# Patient Record
Sex: Male | Born: 1980
Health system: Southern US, Community
[De-identification: ages and names within clinical notes are randomized; demographics above are authoritative.]

---

## 2016-01-21 DIAGNOSIS — D2261 Melanocytic nevi of right upper limb, including shoulder: Secondary | ICD-10-CM | POA: Diagnosis not present

## 2016-01-21 DIAGNOSIS — D225 Melanocytic nevi of trunk: Secondary | ICD-10-CM | POA: Diagnosis not present

## 2016-01-21 DIAGNOSIS — D2271 Melanocytic nevi of right lower limb, including hip: Secondary | ICD-10-CM | POA: Diagnosis not present

## 2016-01-21 DIAGNOSIS — Z872 Personal history of diseases of the skin and subcutaneous tissue: Secondary | ICD-10-CM | POA: Diagnosis not present

## 2016-10-07 DIAGNOSIS — D225 Melanocytic nevi of trunk: Secondary | ICD-10-CM | POA: Diagnosis not present

## 2016-10-07 DIAGNOSIS — Z872 Personal history of diseases of the skin and subcutaneous tissue: Secondary | ICD-10-CM | POA: Diagnosis not present

## 2016-10-07 DIAGNOSIS — D2272 Melanocytic nevi of left lower limb, including hip: Secondary | ICD-10-CM | POA: Diagnosis not present

## 2016-10-16 ENCOUNTER — Emergency Department (HOSPITAL_COMMUNITY): Payer: 59

## 2016-10-16 ENCOUNTER — Encounter (HOSPITAL_COMMUNITY): Payer: Self-pay | Admitting: Emergency Medicine

## 2016-10-16 ENCOUNTER — Emergency Department (HOSPITAL_COMMUNITY)
Admission: EM | Admit: 2016-10-16 | Discharge: 2016-10-16 | Disposition: A | Discharge status: Home / Self Care | Payer: 59 | Attending: Emergency Medicine | Admitting: Emergency Medicine

## 2016-10-16 DIAGNOSIS — F17210 Nicotine dependence, cigarettes, uncomplicated: Secondary | ICD-10-CM | POA: Insufficient documentation

## 2016-10-16 DIAGNOSIS — R109 Unspecified abdominal pain: Secondary | ICD-10-CM | POA: Diagnosis not present

## 2016-10-16 DIAGNOSIS — R1011 Right upper quadrant pain: Secondary | ICD-10-CM | POA: Diagnosis not present

## 2016-10-16 LAB — CBC WITH DIFFERENTIAL/PLATELET
Basophils Absolute: 0 10*3/uL (ref 0.0–0.1)
Basophils Relative: 0 %
EOS ABS: 0.1 10*3/uL (ref 0.0–0.7)
EOS PCT: 2 %
HCT: 44.9 % (ref 39.0–52.0)
Hemoglobin: 15.1 g/dL (ref 13.0–17.0)
LYMPHS ABS: 3.1 10*3/uL (ref 0.7–4.0)
Lymphocytes Relative: 40 %
MCH: 30.9 pg (ref 26.0–34.0)
MCHC: 33.6 g/dL (ref 30.0–36.0)
MCV: 92 fL (ref 78.0–100.0)
MONOS PCT: 13 %
Monocytes Absolute: 1 10*3/uL (ref 0.1–1.0)
Neutro Abs: 3.6 10*3/uL (ref 1.7–7.7)
Neutrophils Relative %: 45 %
PLATELETS: 229 10*3/uL (ref 150–400)
RBC: 4.88 MIL/uL (ref 4.22–5.81)
RDW: 13.4 % (ref 11.5–15.5)
WBC: 7.9 10*3/uL (ref 4.0–10.5)

## 2016-10-16 LAB — COMPREHENSIVE METABOLIC PANEL
ALK PHOS: 58 U/L (ref 38–126)
ALT: 18 U/L (ref 17–63)
AST: 18 U/L (ref 15–41)
Albumin: 4.3 g/dL (ref 3.5–5.0)
Anion gap: 8 (ref 5–15)
BILIRUBIN TOTAL: 0.7 mg/dL (ref 0.3–1.2)
BUN: 15 mg/dL (ref 6–20)
CALCIUM: 8.9 mg/dL (ref 8.9–10.3)
CO2: 27 mmol/L (ref 22–32)
CREATININE: 0.71 mg/dL (ref 0.61–1.24)
Chloride: 102 mmol/L (ref 101–111)
GFR calc non Af Amer: >60 mL/min (ref >60)
GLUCOSE: 104 mg/dL — AB (ref 65–99)
Potassium: 3.7 mmol/L (ref 3.5–5.1)
SODIUM: 137 mmol/L (ref 135–145)
TOTAL PROTEIN: 7.9 g/dL (ref 6.5–8.1)

## 2016-10-16 LAB — URINALYSIS, ROUTINE W REFLEX MICROSCOPIC
BILIRUBIN URINE: NEGATIVE
Glucose, UA: NEGATIVE mg/dL
HGB URINE DIPSTICK: NEGATIVE
KETONES UR: NEGATIVE mg/dL
Leukocytes, UA: NEGATIVE
Nitrite: NEGATIVE
PROTEIN: NEGATIVE mg/dL
Specific Gravity, Urine: 1.029 (ref 1.005–1.030)
pH: 7 (ref 5.0–8.0)

## 2016-10-16 LAB — LIPASE, BLOOD: Lipase: 33 U/L (ref 11–51)

## 2016-10-16 MED ORDER — ONDANSETRON HCL 4 MG/2ML IJ SOLN: 4.0000 mg | INTRAMUSCULAR | Status: DC | PRN

## 2016-10-16 MED ORDER — MORPHINE SULFATE (PF) 2 MG/ML IV SOLN: 2.0000 mg | INTRAVENOUS | Status: DC | PRN

## 2016-10-16 MED ORDER — IOPAMIDOL (ISOVUE-300) INJECTION 61%
100.0000 mL | Freq: Once | INTRAVENOUS | Status: AC | PRN
  Administered 2016-10-16: 100 mL via INTRAVENOUS

## 2016-10-16 MED ORDER — FAMOTIDINE 20 MG PO TABS: 20.0000 mg | ORAL_TABLET | Freq: Two times a day (BID) | ORAL | 0 refills | Status: AC

## 2016-10-16 MED ORDER — HYDROCODONE-ACETAMINOPHEN 5-325 MG PO TABS: ORAL_TABLET | ORAL | 0 refills | Status: AC

## 2016-10-16 NOTE — ED Notes (Signed)
Pt. Reports increase in pain. Pt. Offered prn pain medication and denied it. Will continue to monitor.

## 2016-10-16 NOTE — ED Triage Notes (Signed)
Pt c/o right side abd pain since 0100 Wednesday am. Denies n/v/d.

## 2016-10-16 NOTE — Discharge Instructions (Signed)
Eat a bland diet, avoiding greasy, fatty, fried foods, as well as spicy and acidic foods or beverages.  Avoid eating within the hour or 2 before going to bed or laying down.  Also avoid teas, colas, coffee, chocolate, pepermint and spearment.  Take the prescriptions as directed.  Your CT scan showed an incidental finding:  "Two peripherally enhancing abnormalities are noted in the right hepatic lobe, the largest measuring 6.4 x 5.5 cm. These may simply represent hemangiomas, but MRI with and without gadolinium is recommended to rule out other neoplasm or pathology."   Call your regular medical doctor and the GI doctor today to schedule a follow up appointment within the next week.  Return to the Emergency Department immediately if worsening.

## 2016-10-16 NOTE — ED Notes (Signed)
Lab unable to use urine sample intially sent to lab. UA reordered and pt asked to recollect.

## 2016-10-16 NOTE — ED Provider Notes (Signed)
[facility] DEPT Provider Note   CSN: [id number] Arrival date & time: 10/16/16  0947     History   Chief Complaint Chief Complaint  Patient presents with  . Abdominal Pain    HPI Jacobe Study Sukup is a 36 y.o. male.  HPI  Pt was seen at 0710.  Per pt, c/o gradual onset and persistence of constant RUQ abd "pain" since yesterday.  Has been associated with no other symptoms.  Denies N/V, no diarrhea, no fevers, no back pain, no rash, no CP/SOB, no black or blood in stools.      History reviewed. No pertinent past medical history.  There are no active problems to display for this patient.   History reviewed. No pertinent surgical history.     Home Medications    Prior to Admission medications   Not on File    Family History No family history on file.  Social History Social History  Substance Use Topics  . Smoking status: Current Every Day Smoker    Packs/day: 0.50    Types: Cigarettes  . Smokeless tobacco: Never Used  . Alcohol use Yes     Comment: moderate     Allergies   Patient has no known allergies.   Review of Systems Review of Systems ROS: Statement: All systems negative except as marked or noted in the HPI; Constitutional: Negative for fever and chills. ; ; Eyes: Negative for eye pain, redness and discharge. ; ; ENMT: Negative for ear pain, hoarseness, nasal congestion, sinus pressure and sore throat. ; ; Cardiovascular: Negative for chest pain, palpitations, diaphoresis, dyspnea and peripheral edema. ; ; Respiratory: Negative for cough, wheezing and stridor. ; ; Gastrointestinal: +abd pain. Negative for nausea, vomiting, diarrhea, blood in stool, hematemesis, jaundice and rectal bleeding. . ; ; Genitourinary: Negative for dysuria, flank pain and hematuria. ; ; Genital:  No penile drainage or rash, no testicular pain or swelling, no scrotal rash or swelling. ;; Musculoskeletal: Negative for back pain and neck pain. Negative for swelling and trauma.; ;   Skin: Negative for pruritus, rash, abrasions, blisters, bruising and skin lesion.; ; Neuro: Negative for headache, lightheadedness and neck stiffness. Negative for weakness, altered level of consciousness, altered mental status, extremity weakness, paresthesias, involuntary movement, seizure and syncope.       Physical Exam Updated Vital Signs BP 112/77   Pulse 76   Temp 98.1 F (36.7 C)   Resp 18   Ht 5\' 9"  (1.753 m)   Wt 63.5 kg (140 lb)   SpO2 100%   BMI 20.67 kg/m   Physical Exam 0715: Physical examination:  Nursing notes reviewed; Vital signs and O2 SAT reviewed;  Constitutional: Well developed, Well nourished, Well hydrated, In no acute distress; Head:  Normocephalic, atraumatic; Eyes: EOMI, PERRL, No scleral icterus; ENMT: Mouth and pharynx normal, Mucous membranes moist; Neck: Supple, Full range of motion, No lymphadenopathy; Cardiovascular: Regular rate and rhythm, No gallop; Respiratory: Breath sounds clear & equal bilaterally, No wheezes.  Speaking full sentences with ease, Normal respiratory effort/excursion; Chest: Nontender, Movement normal; Abdomen: Soft, +RUQ tenderness to palp. No rebound or guarding. Nondistended, Normal bowel sounds; Genitourinary: No CVA tenderness; Extremities: Pulses normal, No tenderness, No edema, No calf edema or asymmetry.; Neuro: AA&Ox3, Major CN grossly intact.  Speech clear. No gross focal motor or sensory deficits in extremities.; Skin: Color normal, Warm, Dry.   ED Treatments / Results  Labs (all labs ordered are listed, but only abnormal results are displayed)   EKG  EKG Interpretation None       Radiology   Procedures Procedures (including critical care time)  Medications Ordered in ED Medications  ondansetron (ZOFRAN) injection 4 mg (not administered)  morphine 2 MG/ML injection 2 mg (not administered)     Initial Impression / Assessment and Plan / ED Course  I have reviewed the triage vital signs and the nursing  notes.  Pertinent labs & imaging results that were available during my care of the patient were reviewed by me and considered in my medical decision making (see chart for details).  MDM Reviewed: previous chart, nursing note and vitals Reviewed previous: labs Interpretation: labs, x-ray and ultrasound    Results for orders placed or performed during the hospital encounter of 10/16/16  Comprehensive metabolic panel  Result Value Ref Range   Sodium 137 135 - 145 mmol/L   Potassium 3.7 3.5 - 5.1 mmol/L   Chloride 102 101 - 111 mmol/L   CO2 27 22 - 32 mmol/L   Glucose, Bld 104 (H) 65 - 99 mg/dL   BUN 15 6 - 20 mg/dL   Creatinine, Ser 0.71 0.61 - 1.24 mg/dL   Calcium 8.9 8.9 - 10.3 mg/dL   Total Protein 7.9 6.5 - 8.1 g/dL   Albumin 4.3 3.5 - 5.0 g/dL   AST 18 15 - 41 U/L   ALT 18 17 - 63 U/L   Alkaline Phosphatase 58 38 - 126 U/L   Total Bilirubin 0.7 0.3 - 1.2 mg/dL   GFR calc non Af Amer >60 >60 mL/min   GFR calc Af Amer >60 >60 mL/min   Anion gap 8 5 - 15  Lipase, blood  Result Value Ref Range   Lipase 33 11 - 51 U/L  CBC with Differential  Result Value Ref Range   WBC 7.9 4.0 - 10.5 K/uL   RBC 4.88 4.22 - 5.81 MIL/uL   Hemoglobin 15.1 13.0 - 17.0 g/dL   HCT 44.9 39.0 - 52.0 %   MCV 92.0 78.0 - 100.0 fL   MCH 30.9 26.0 - 34.0 pg   MCHC 33.6 30.0 - 36.0 g/dL   RDW 13.4 11.5 - 15.5 %   Platelets 229 150 - 400 K/uL   Neutrophils Relative % 45 %   Neutro Abs 3.6 1.7 - 7.7 K/uL   Lymphocytes Relative 40 %   Lymphs Abs 3.1 0.7 - 4.0 K/uL   Monocytes Relative 13 %   Monocytes Absolute 1.0 0.1 - 1.0 K/uL   Eosinophils Relative 2 %   Eosinophils Absolute 0.1 0.0 - 0.7 K/uL   Basophils Relative 0 %   Basophils Absolute 0.0 0.0 - 0.1 K/uL  Urinalysis, Routine w reflex microscopic  Result Value Ref Range   Color, Urine COLORLESS (A) YELLOW   APPearance CLEAR CLEAR   Specific Gravity, Urine 1.029 1.005 - 1.030   pH 7.0 5.0 - 8.0   Glucose, UA NEGATIVE NEGATIVE mg/dL    Hgb urine dipstick NEGATIVE NEGATIVE   Bilirubin Urine NEGATIVE NEGATIVE   Ketones, ur NEGATIVE NEGATIVE mg/dL   Protein, ur NEGATIVE NEGATIVE mg/dL   Nitrite NEGATIVE NEGATIVE   Leukocytes, UA NEGATIVE NEGATIVE   US Abdomen Complete Result Date: 10/16/2016 CLINICAL DATA:  Right upper quadrant pain for 1 day EXAM: ABDOMEN ULTRASOUND COMPLETE COMPARISON:  None. FINDINGS: Gallbladder: No gallstones or wall thickening visualized. No sonographic Murphy sign noted by sonographer. Common bile duct: Diameter: Normal caliber, 2 mm Liver: No focal lesion identified. Within normal limits in parenchymal echogenicity.  IVC: No abnormality visualized. Pancreas: Visualized portion unremarkable. Spleen: Size and appearance within normal limits. Right Kidney: Length: 12.3 cm. Echogenicity within normal limits. No mass or hydronephrosis visualized. Left Kidney: Length: 12.0 cm. Echogenicity within normal limits. No mass or hydronephrosis visualized. Abdominal aorta: No aneurysm visualized. Other findings: None. IMPRESSION: Unremarkable abdominal ultrasound. Electronically Signed   By: Rolm Baptise M.D.   On: 10/16/2016 08:35   Ct Abdomen Pelvis W Contrast Result Date: 10/16/2016 CLINICAL DATA:  Acute right-sided abdominal pain. EXAM: CT ABDOMEN AND PELVIS WITH CONTRAST TECHNIQUE: Multidetector CT imaging of the abdomen and pelvis was performed using the standard protocol following bolus administration of intravenous contrast. CONTRAST:  167mL ISOVUE-300 IOPAMIDOL (ISOVUE-300) INJECTION 61% COMPARISON:  Ultrasound of same day. FINDINGS: Lower chest: No acute abnormality. Hepatobiliary: No gallstones are noted. 6.4 x 5.5 cm lobulated peripherally enhancing abnormality is noted in the dome of the right hepatic lobe, with similar but smaller abnormality seen more inferiorly in right hepatic lobe measuring 3.8 x 2.4 cm. These may simply represent hemangiomas, but MRI is recommended to rule out other pathology or neoplasm.  Pancreas: Unremarkable. No pancreatic ductal dilatation or surrounding inflammatory changes. Spleen: Normal in size without focal abnormality. Adrenals/Urinary Tract: Adrenal glands are unremarkable. Kidneys are normal, without renal calculi, focal lesion, or hydronephrosis. Bladder is unremarkable. Stomach/Bowel: There is no evidence of bowel obstruction or inflammation. The stomach appears normal. The appendix is not visualized, but no inflammation is noted in the right lower quadrant of the abdomen. Vascular/Lymphatic: No significant vascular findings are present. No enlarged abdominal or pelvic lymph nodes. Reproductive: Prostate is unremarkable. Other: No abdominal wall hernia or abnormality. No abdominopelvic ascites. Musculoskeletal: No acute or significant osseous findings. IMPRESSION: Two peripherally enhancing abnormalities are noted in the right hepatic lobe, the largest measuring 6.4 x 5.5 cm. These may simply represent hemangiomas, but MRI with and without gadolinium is recommended to rule out other neoplasm or pathology. No other abnormality seen in the abdomen or pelvis. Electronically Signed   By: Marijo Conception, M.D.   On: 10/16/2016 09:55   Dg Abd Acute W/chest Result Date: 10/16/2016 CLINICAL DATA:  Two weeks of right upper quadrant pain. No other complaints. Current smoker. EXAM: DG ABDOMEN ACUTE W/ 1V CHEST COMPARISON:  None in PACs FINDINGS: The lungs are well-expanded. There is no alveolar infiltrate. The interstitial markings are coarse. The heart and pulmonary vascularity are normal. There is no pleural effusion. The bony thorax is unremarkable. Within the abdomen there is relative paucity of bowel gas in the mid and right upper abdomen. The stool burden is moderate. There is punctate radiodensity projecting over the lower pole of the right kidney. No free extraluminal gas collections are observed. The bony structures are unremarkable. IMPRESSION: No acute cardiopulmonary abnormality.  Mild interstitial prominence may reflect the smoking history. Paucity of bowel gas in the mid and right upper abdomen. This may reflect enlargement of the liver or other visceral structure. No evidence of bowel obstruction. Further evaluation with abdominal ultrasound is recommended in nectar in an effort to exclude hepatic or gallbladder pathology. Probable punctate lower pole kidney stone on the right. Electronically Signed   By: David  Martinique M.D.   On: 10/16/2016 08:06    1040:  Workup reassuring. Tx symptomatically, f/u GI MD. Dx and testing d/w pt.  Questions answered.  Verb understanding, agreeable to d/c home with outpt f/u.    Final Clinical Impressions(s) / ED Diagnoses   Final diagnoses:  None  New Prescriptions New Prescriptions   No medications on file     Francine Graven, DO 10/20/16 1239

## 2016-10-16 NOTE — ED Notes (Signed)
Pt denies need for pain or nausea meds at this time.

## 2016-10-22 ENCOUNTER — Other Ambulatory Visit (HOSPITAL_COMMUNITY): Payer: Self-pay | Admitting: Internal Medicine

## 2016-10-22 DIAGNOSIS — R160 Hepatomegaly, not elsewhere classified: Secondary | ICD-10-CM

## 2016-11-05 ENCOUNTER — Ambulatory Visit (HOSPITAL_COMMUNITY)
Admission: RE | Admit: 2016-11-05 | Discharge: 2016-11-05 | Disposition: A | Discharge status: Home / Self Care | Payer: 59 | Source: Ambulatory Visit | Attending: Internal Medicine | Admitting: Internal Medicine

## 2016-11-05 DIAGNOSIS — R160 Hepatomegaly, not elsewhere classified: Secondary | ICD-10-CM | POA: Diagnosis not present

## 2016-11-05 DIAGNOSIS — D1803 Hemangioma of intra-abdominal structures: Secondary | ICD-10-CM | POA: Insufficient documentation

## 2016-11-05 DIAGNOSIS — M5124 Other intervertebral disc displacement, thoracic region: Secondary | ICD-10-CM | POA: Diagnosis not present

## 2016-11-05 DIAGNOSIS — K7689 Other specified diseases of liver: Secondary | ICD-10-CM | POA: Diagnosis not present

## 2016-11-05 MED ORDER — GADOBENATE DIMEGLUMINE 529 MG/ML IV SOLN
15.0000 mL | Freq: Once | INTRAVENOUS | Status: AC | PRN
Start: 2016-11-05 — End: 2016-11-05
  Administered 2016-11-05: 15 mL via INTRAVENOUS

## 2017-10-19 DIAGNOSIS — D225 Melanocytic nevi of trunk: Secondary | ICD-10-CM | POA: Diagnosis not present

## 2017-10-19 DIAGNOSIS — D485 Neoplasm of uncertain behavior of skin: Secondary | ICD-10-CM | POA: Diagnosis not present

## 2017-10-19 DIAGNOSIS — Z872 Personal history of diseases of the skin and subcutaneous tissue: Secondary | ICD-10-CM | POA: Diagnosis not present

## 2018-05-16 IMAGING — CT CT ABD-PELV W/ CM
2 of 4 series · 15 of 46 positions shown, 17 images · IV contrast (iopamidol)
Comparison: Ultrasound of same day.

CLINICAL DATA: Acute right-sided abdominal pain.

EXAM:
CT ABDOMEN AND PELVIS WITH CONTRAST
TECHNIQUE: Multidetector CT imaging of the abdomen and pelvis was performed
using the standard protocol following bolus administration of
intravenous contrast.
CONTRAST:  100mL QXVBGP-HMM IOPAMIDOL (QXVBGP-HMM) INJECTION 61%

[Series 2: axial st · axial · 0.68mm/px · z∈[-569,-179]mm · 12 of 90 slices shown, 14 images]
[im 8/90  soft-tissue]
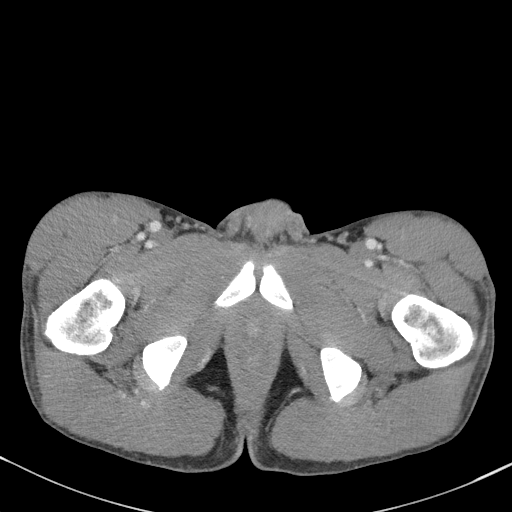
[im 8/90  bone]
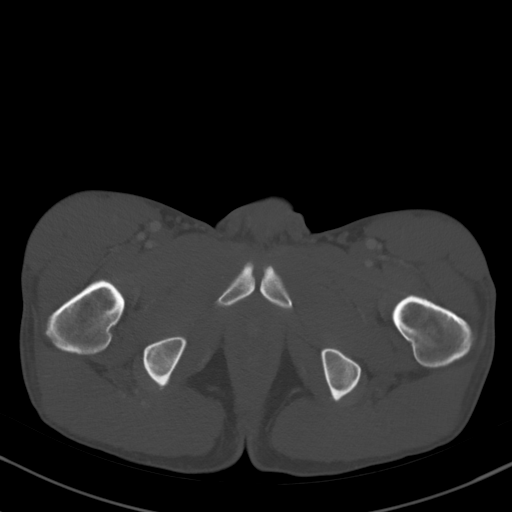
[im 15/90  soft-tissue]
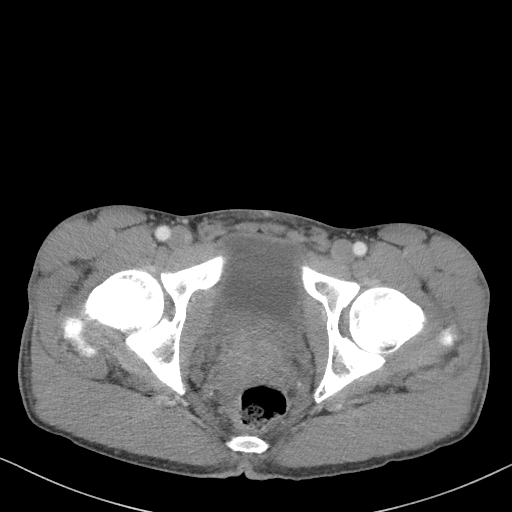
[im 22/90  soft-tissue]
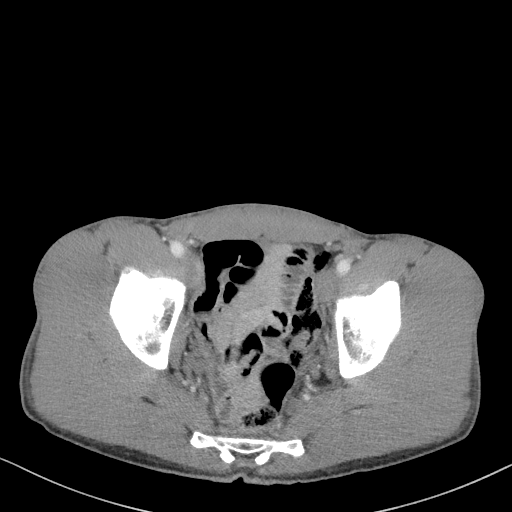
[im 29/90  soft-tissue]
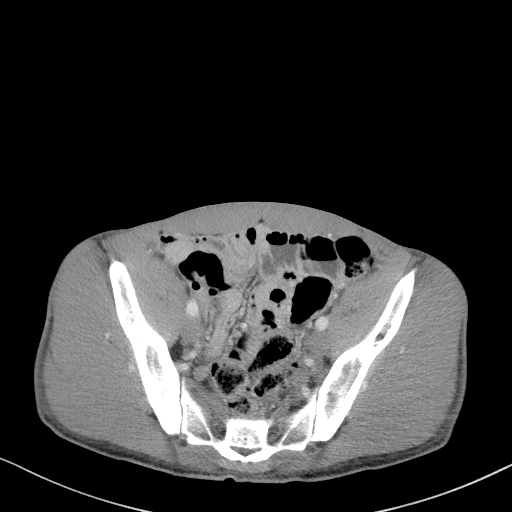
[im 36/90  soft-tissue]
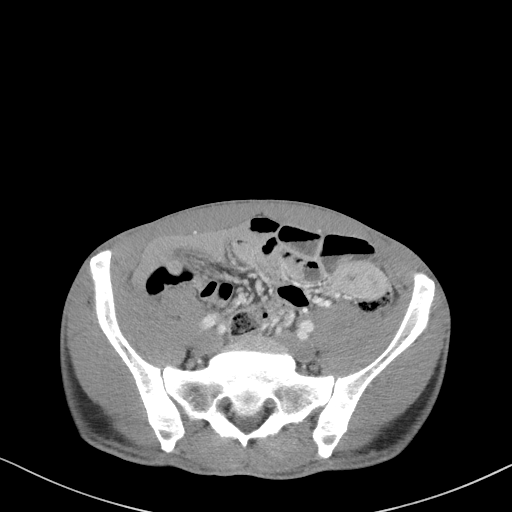
[im 43/90  soft-tissue]
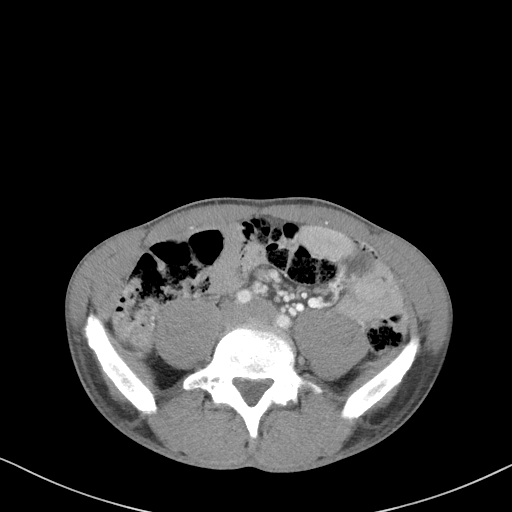
[im 50/90  soft-tissue]
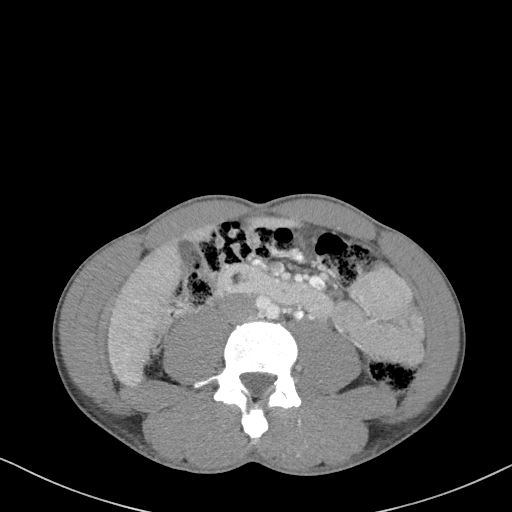
[im 57/90  soft-tissue]
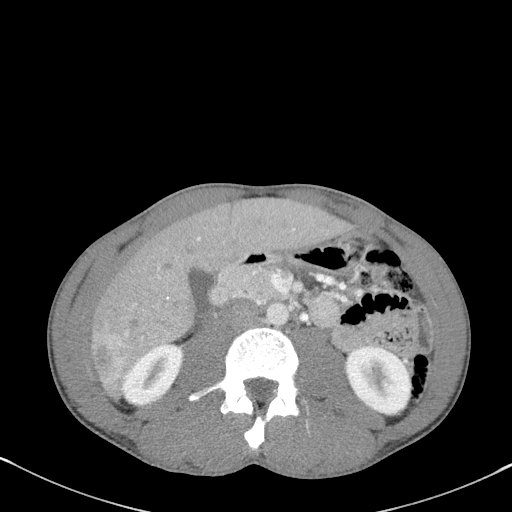
[im 65/90  soft-tissue]
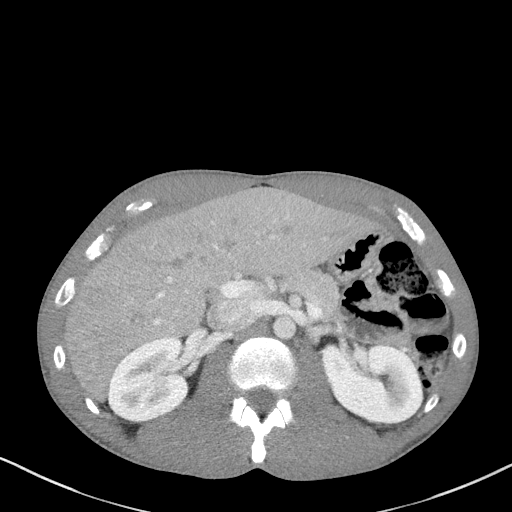
[im 65/90  bone]
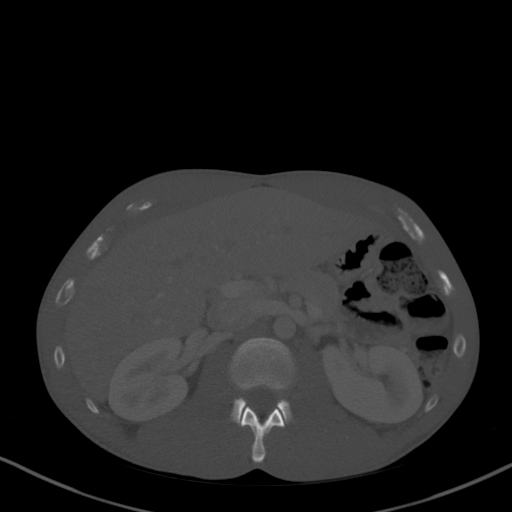
[im 72/90  soft-tissue]
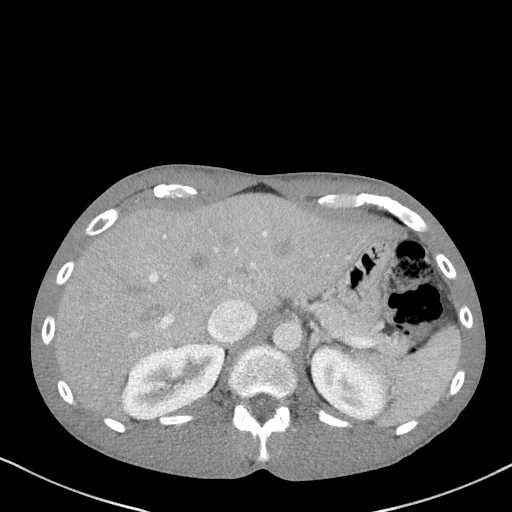
[im 79/90  soft-tissue]
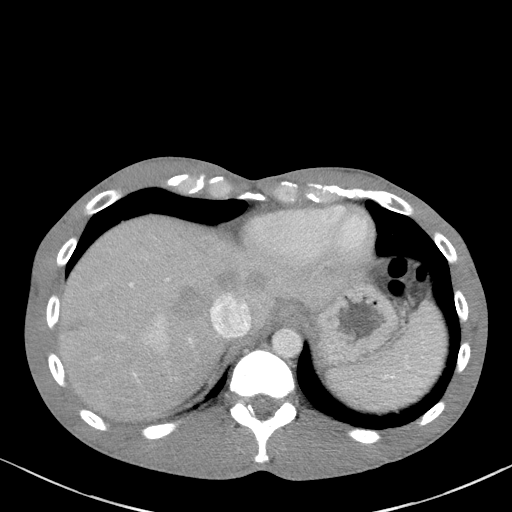
[im 86/90  soft-tissue]
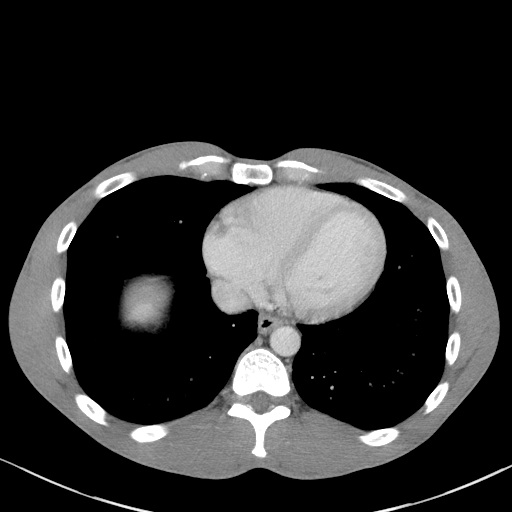

[Series 5: coronal st · coronal · 0.57mm/px · 3 of 78 slices shown]
[im 26/78  soft-tissue]
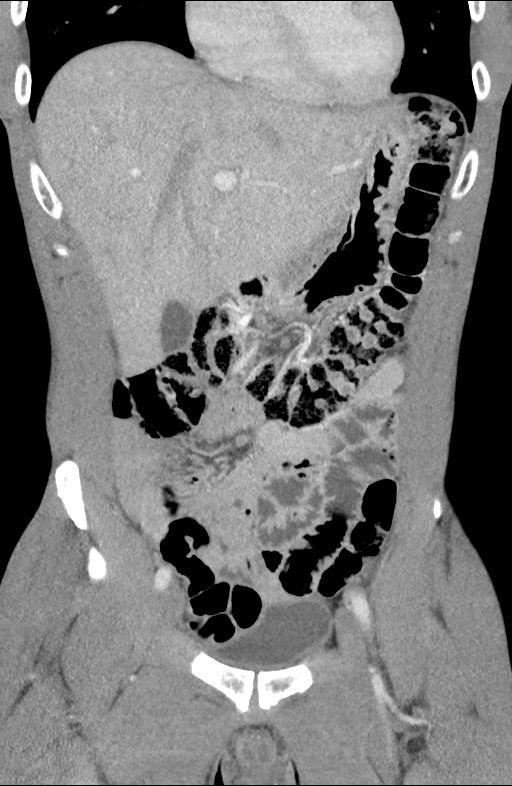
[im 35/78  soft-tissue]
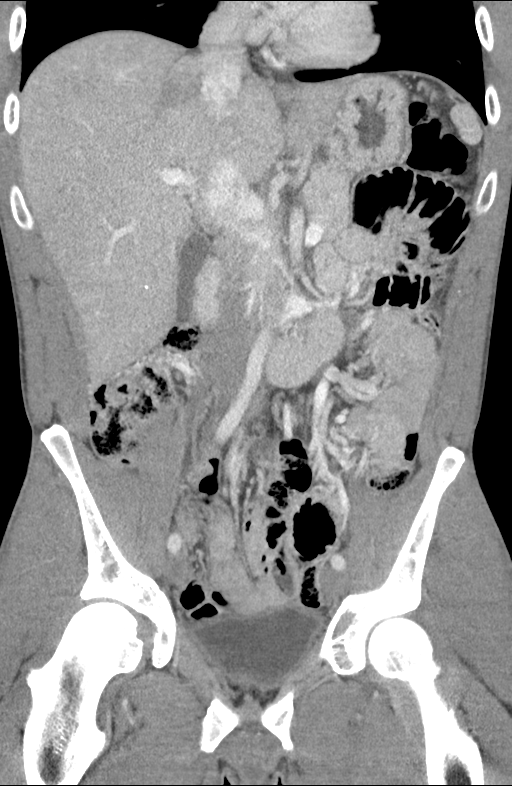
[im 43/78  soft-tissue]
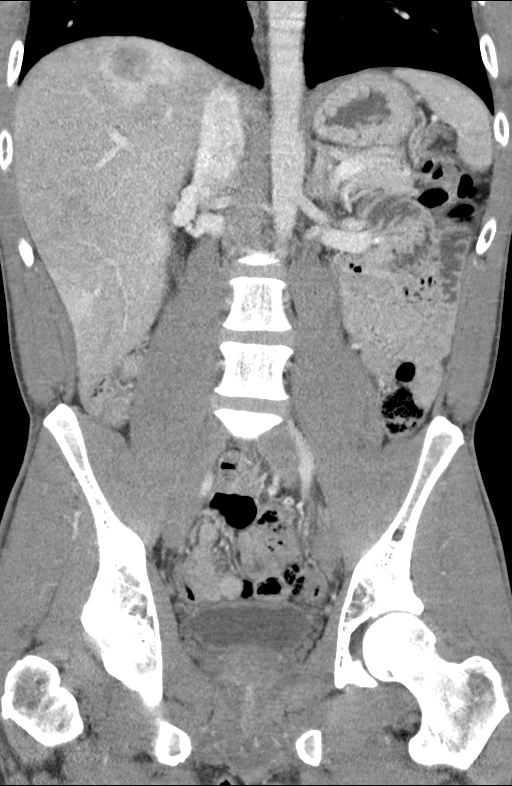

[15 of 46 positions shown; findings below may reference images not displayed]

FINDINGS: Lower chest: No acute abnormality.

Hepatobiliary: No gallstones are noted. 6.4 x 5.5 cm lobulated
peripherally enhancing abnormality is noted in the dome of the right
hepatic lobe, with similar but smaller abnormality seen more
inferiorly in right hepatic lobe measuring 3.8 x 2.4 cm. These may
simply represent hemangiomas, but MRI is recommended to rule out
other pathology or neoplasm.

Pancreas: Unremarkable. No pancreatic ductal dilatation or
surrounding inflammatory changes.

Spleen: Normal in size without focal abnormality.

Adrenals/Urinary Tract: Adrenal glands are unremarkable. Kidneys are
normal, without renal calculi, focal lesion, or hydronephrosis.
Bladder is unremarkable.

Stomach/Bowel: There is no evidence of bowel obstruction or
inflammation. The stomach appears normal. The appendix is not
visualized, but no inflammation is noted in the right lower quadrant
of the abdomen.

Vascular/Lymphatic: No significant vascular findings are present. No
enlarged abdominal or pelvic lymph nodes.

Reproductive: Prostate is unremarkable.

Other: No abdominal wall hernia or abnormality. No abdominopelvic
ascites.

Musculoskeletal: No acute or significant osseous findings.
IMPRESSION: Two peripherally enhancing abnormalities are noted in the right
hepatic lobe, the largest measuring 6.4 x 5.5 cm. These may simply
represent hemangiomas, but MRI with and without gadolinium is
recommended to rule out other neoplasm or pathology.

No other abnormality seen in the abdomen or pelvis.

## 2018-05-16 IMAGING — DX DG ABDOMEN ACUTE W/ 1V CHEST
3 series · 3 of 3 positions shown · non-contrast
Comparison: None in PACs

CLINICAL DATA: Two weeks of right upper quadrant pain. No other
complaints. Current smoker.

EXAM:
DG ABDOMEN ACUTE W/ 1V CHEST

[chest pa]
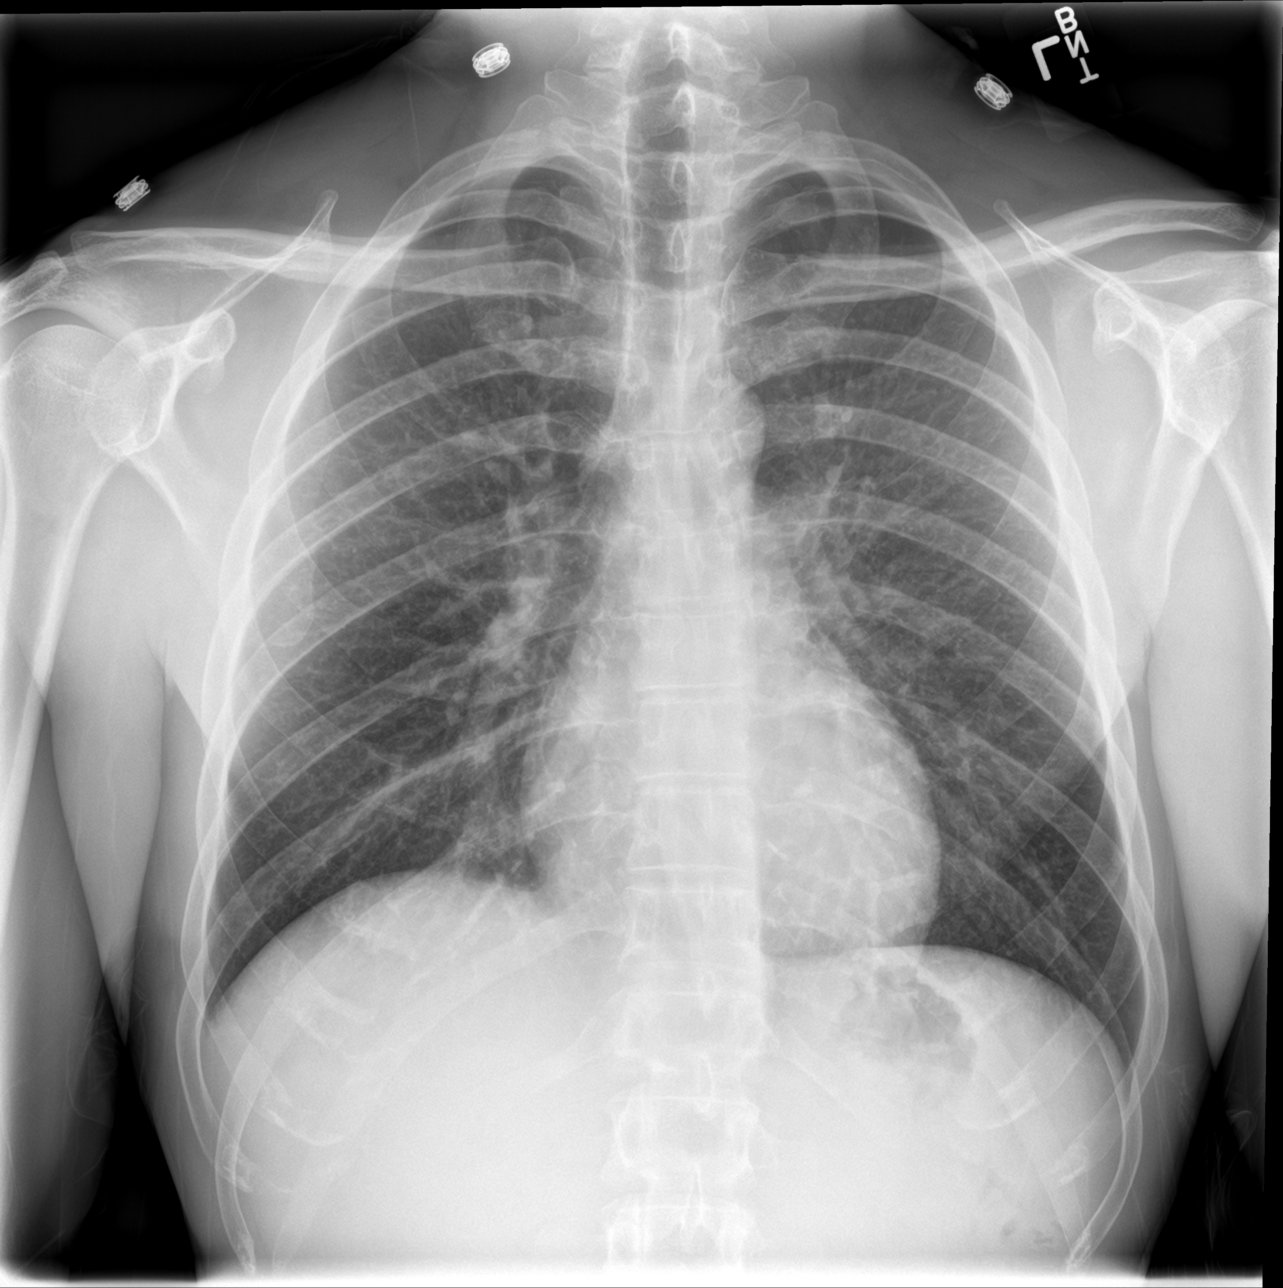

[abdomen erect]
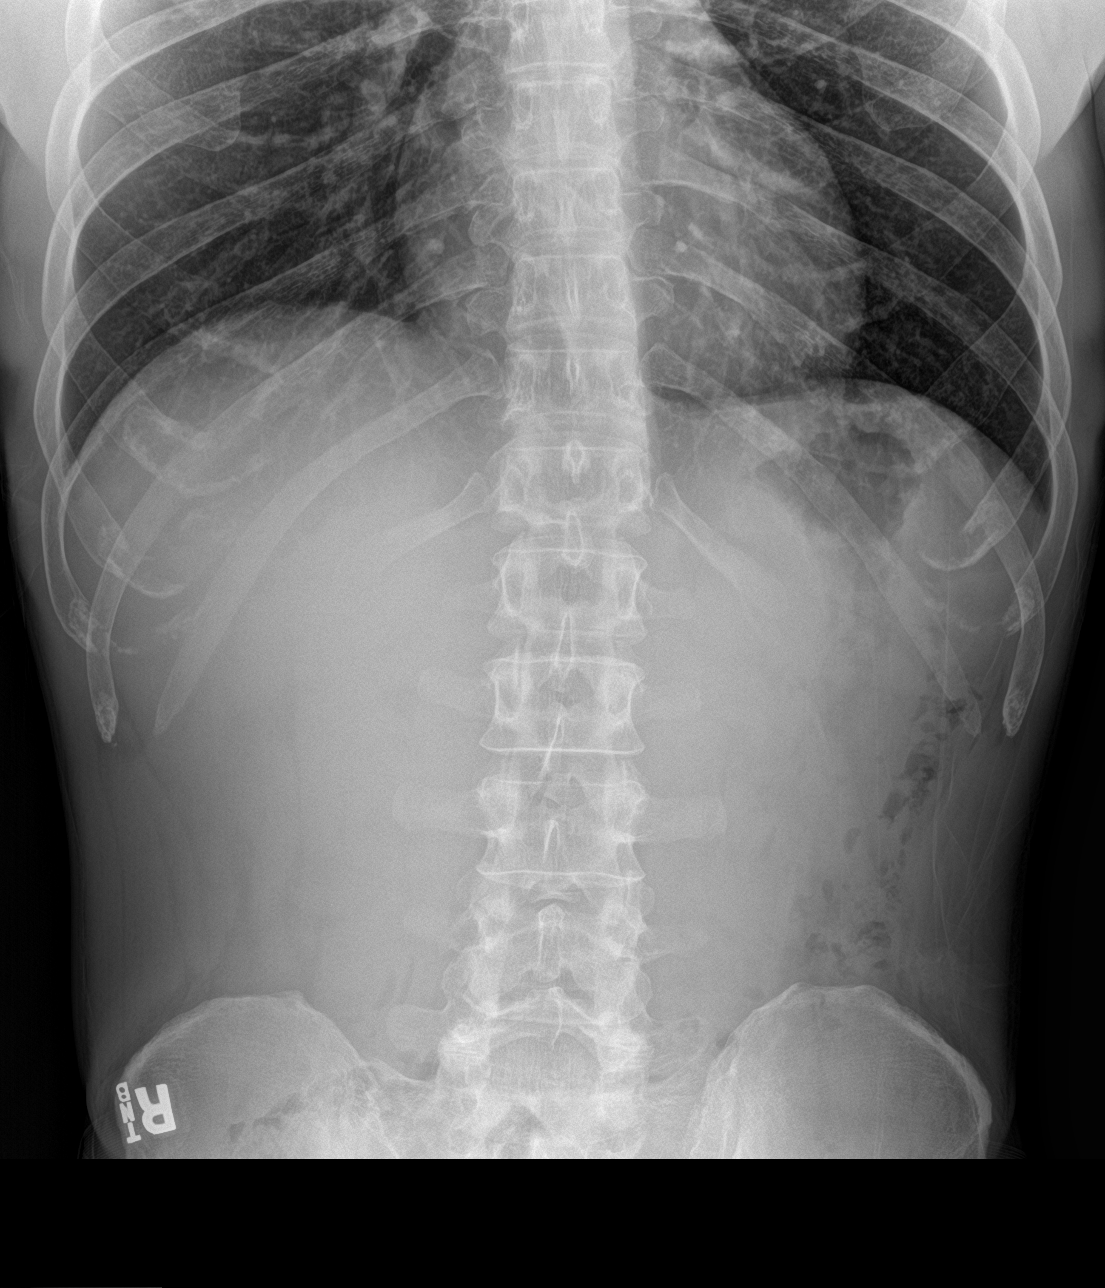

[abdomen supine]
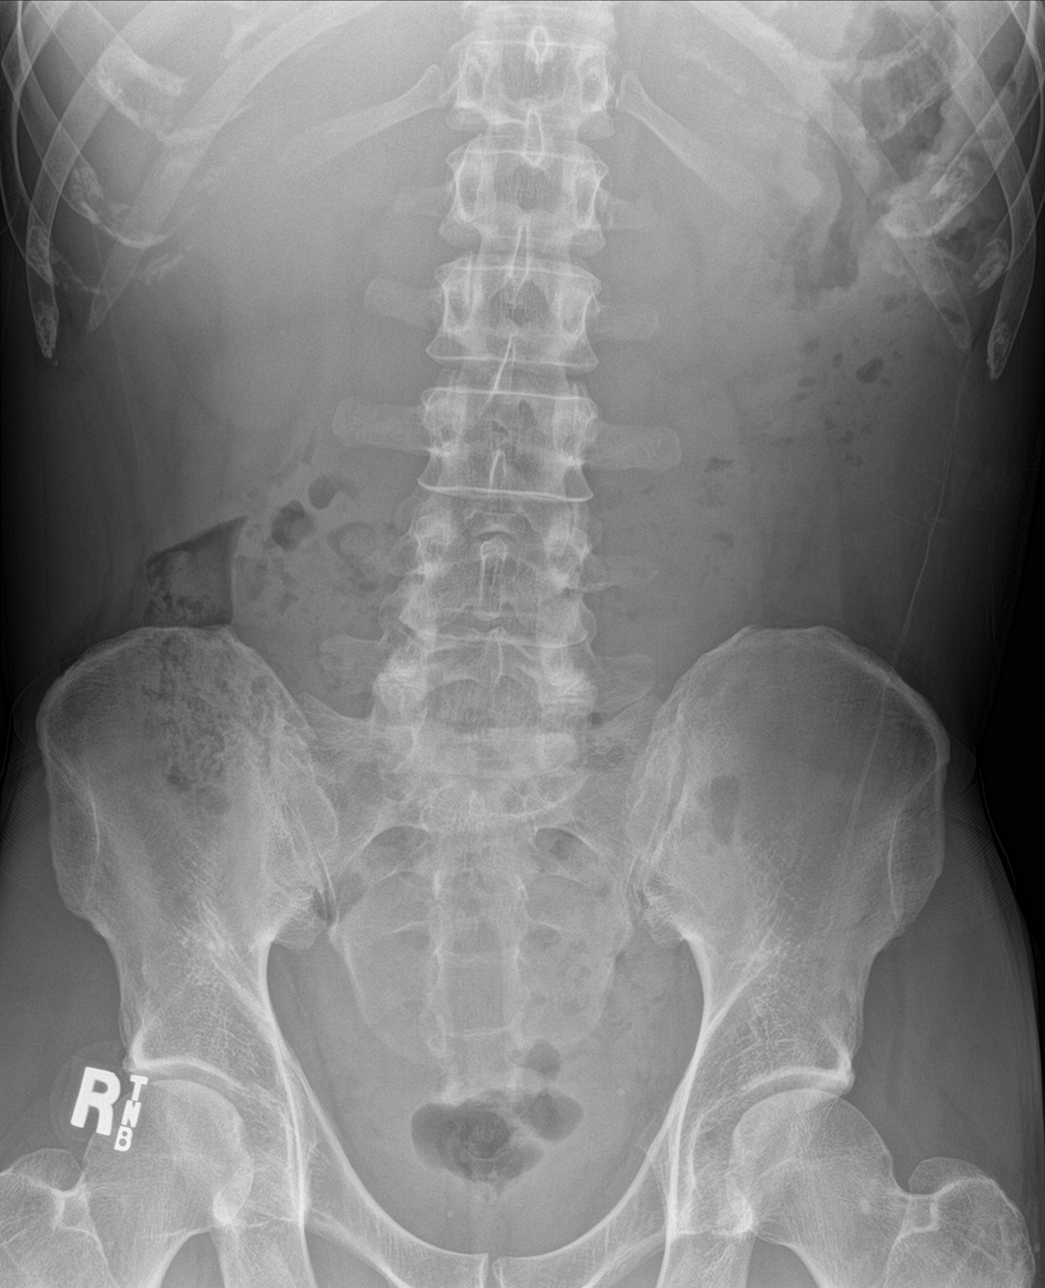

[3 of 3 positions shown; findings below may reference images not displayed]

FINDINGS: The lungs are well-expanded. There is no alveolar infiltrate. The
interstitial markings are coarse. The heart and pulmonary
vascularity are normal. There is no pleural effusion. The bony
thorax is unremarkable.

Within the abdomen there is relative paucity of bowel gas in the mid
and right upper abdomen. The stool burden is moderate. There is
punctate radiodensity projecting over the lower pole of the right
kidney. No free extraluminal gas collections are observed. The bony
structures are unremarkable.
IMPRESSION: No acute cardiopulmonary abnormality. Mild interstitial prominence
may reflect the smoking history.

Paucity of bowel gas in the mid and right upper abdomen. This may
reflect enlargement of the liver or other visceral structure. No
evidence of bowel obstruction. Further evaluation with abdominal
ultrasound is recommended in nectar in an effort to exclude hepatic
or gallbladder pathology.

Probable punctate lower pole kidney stone on the right.

## 2018-07-01 IMAGING — US US ABDOMEN COMPLETE
1 series · 14 of 25 positions shown · non-contrast
Comparison: None.

CLINICAL DATA: Right upper quadrant pain for 1 day

EXAM:
ABDOMEN ULTRASOUND COMPLETE

[Series 1: us abdomen complete · 0.18mm/px · 14 of 105 slices shown]
[im 1/105]
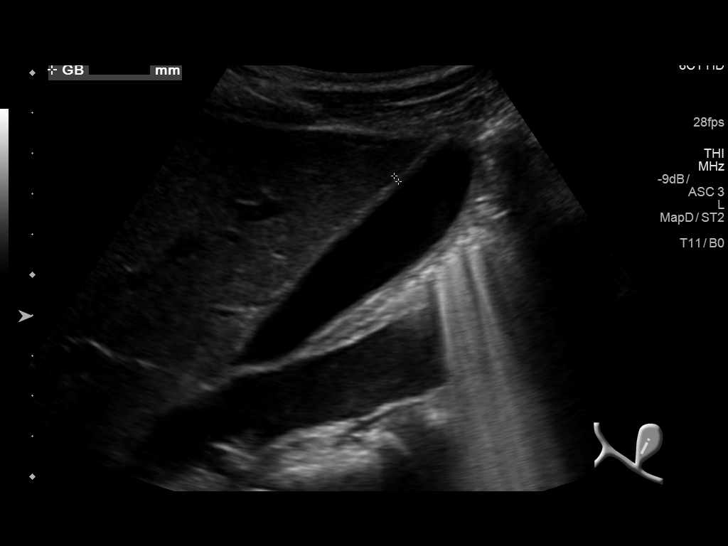
[im 9/105]
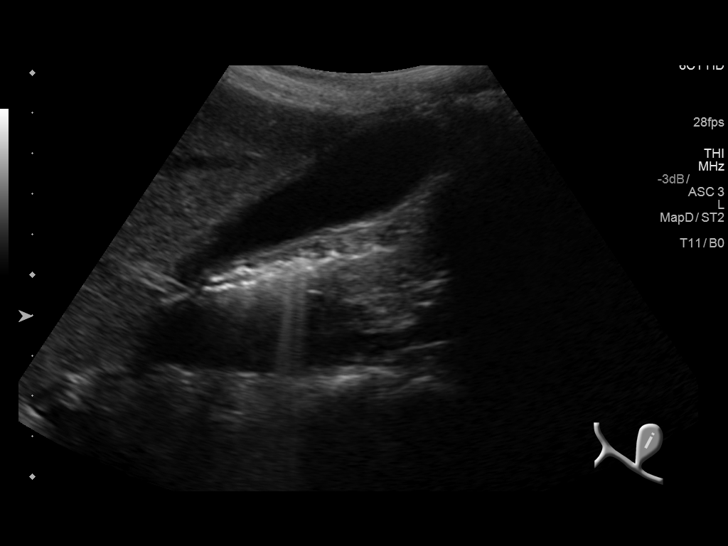
[im 18/105]
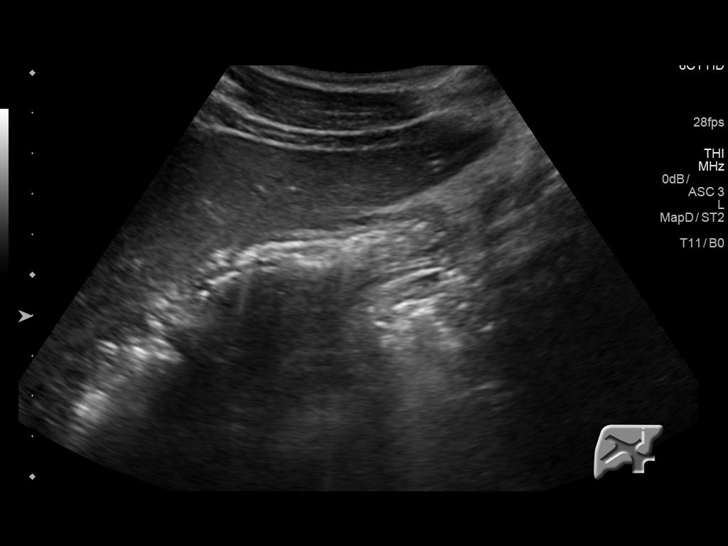
[im 27/105]
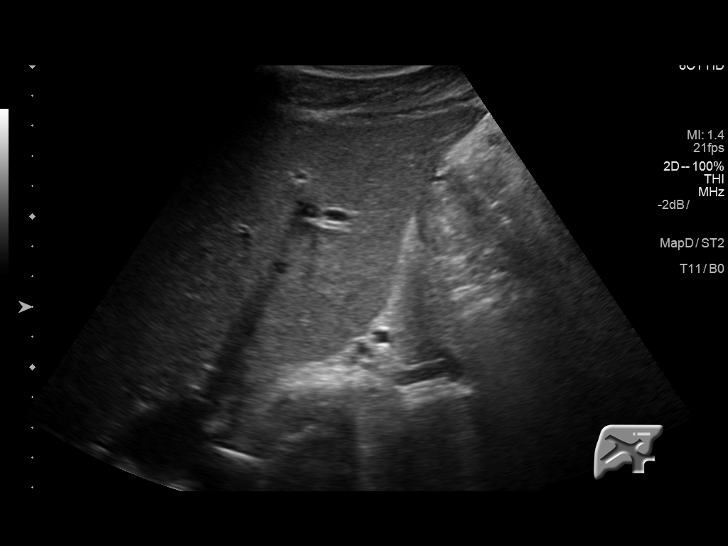
[im 35/105]
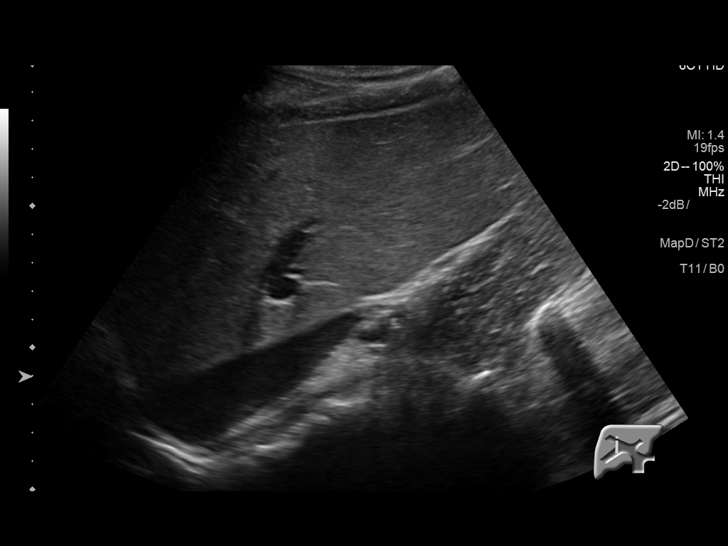
[im 40/105]
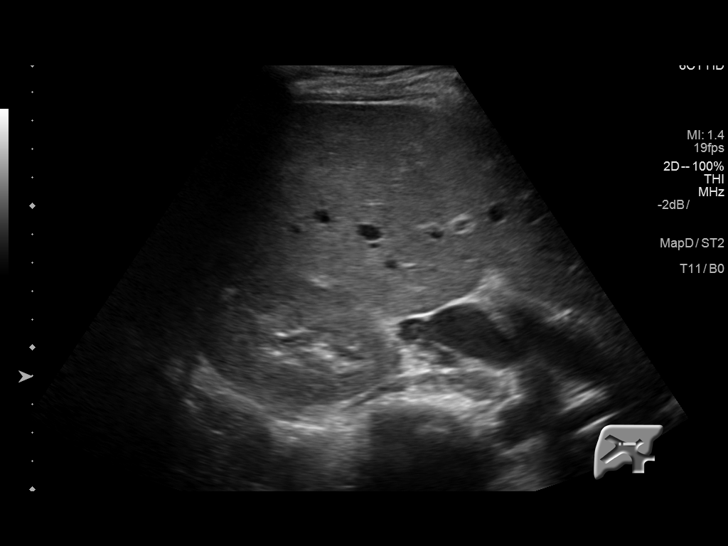
[im 48/105]
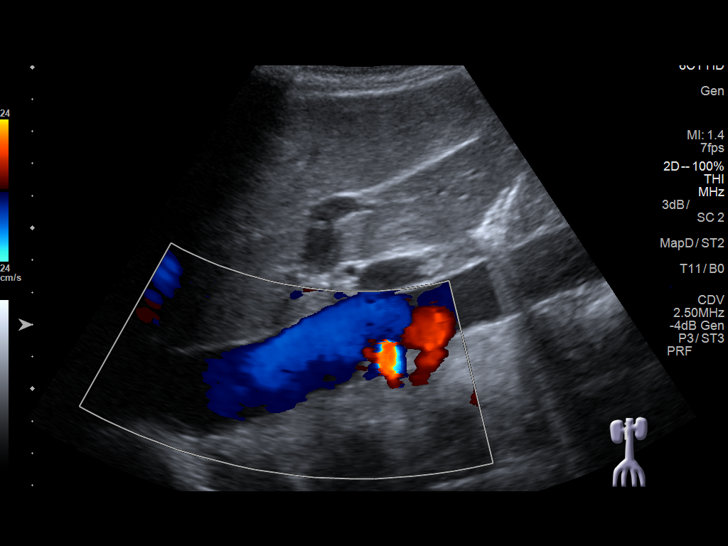
[im 57/105]
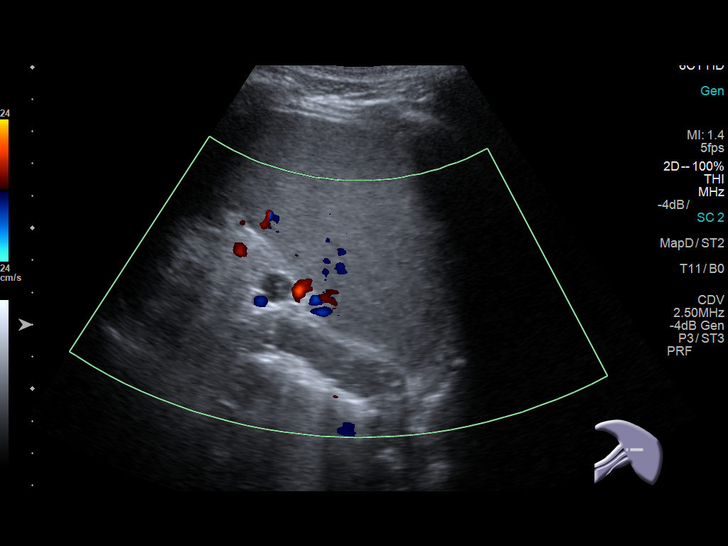
[im 66/105]
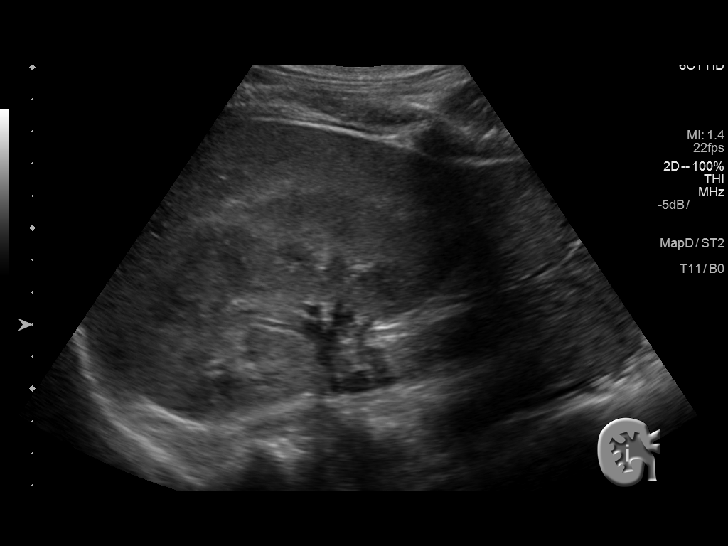
[im 70/105]
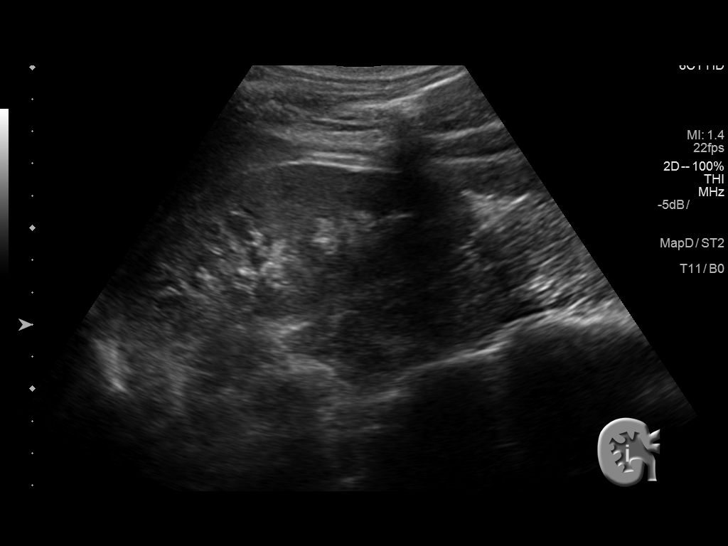
[im 79/105]
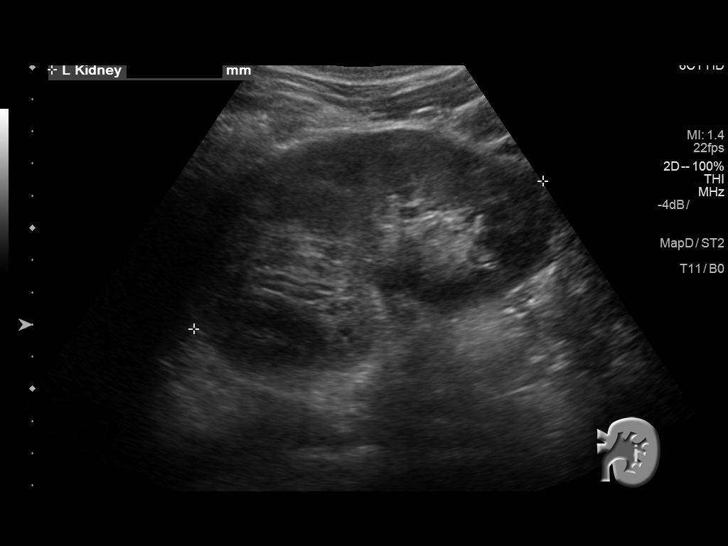
[im 87/105]
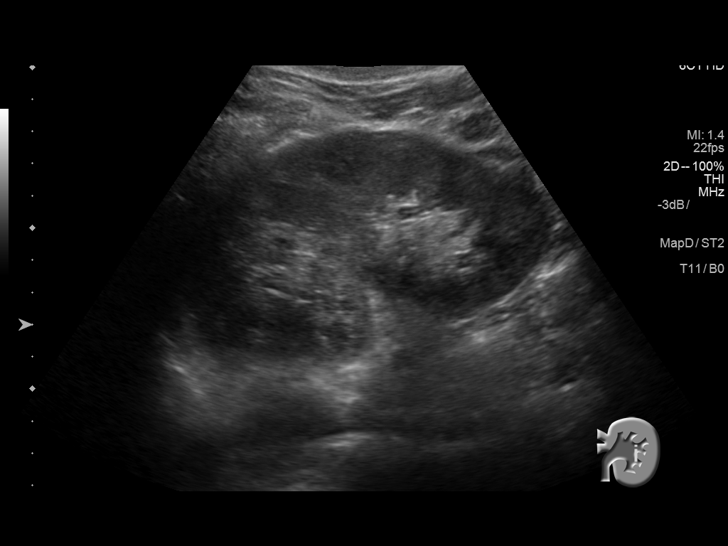
[im 96/105]
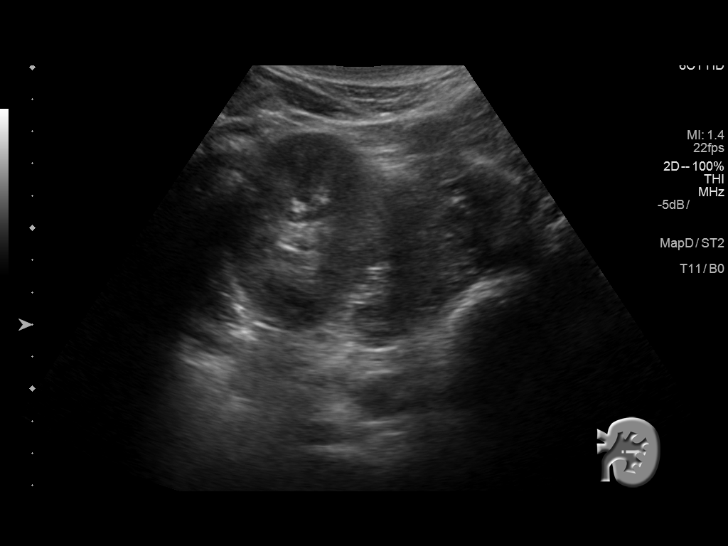
[im 105/105]
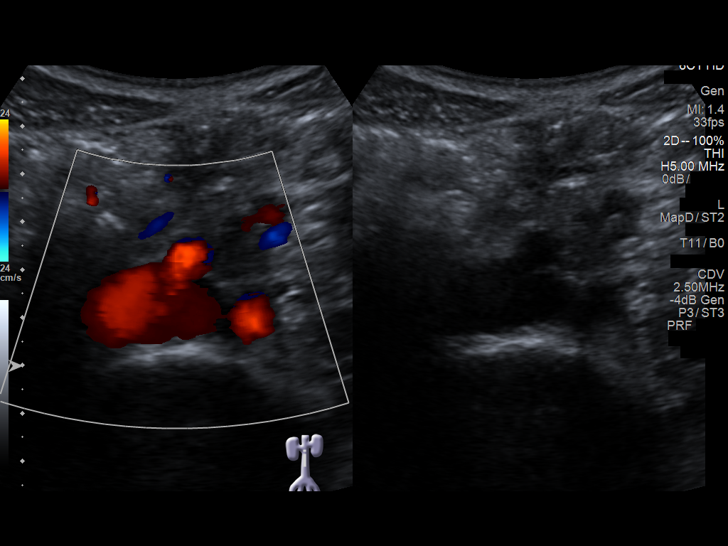

[14 of 25 positions shown; findings below may reference images not displayed]

FINDINGS: Gallbladder: No gallstones or wall thickening visualized. No
sonographic Murphy sign noted by sonographer.

Common bile duct: Diameter: Normal caliber, 2 mm

Liver: No focal lesion identified. Within normal limits in
parenchymal echogenicity.

IVC: No abnormality visualized.

Pancreas: Visualized portion unremarkable.

Spleen: Size and appearance within normal limits.

Right Kidney: Length: 12.3 cm. Echogenicity within normal limits. No
mass or hydronephrosis visualized.

Left Kidney: Length: 12.0 cm. Echogenicity within normal limits. No
mass or hydronephrosis visualized.

Abdominal aorta: No aneurysm visualized.

Other findings: None.
IMPRESSION: Unremarkable abdominal ultrasound.

## 2019-04-11 DIAGNOSIS — M7711 Lateral epicondylitis, right elbow: Secondary | ICD-10-CM | POA: Diagnosis not present

## 2019-04-11 DIAGNOSIS — M79601 Pain in right arm: Secondary | ICD-10-CM | POA: Diagnosis not present

## 2019-04-13 DIAGNOSIS — M7711 Lateral epicondylitis, right elbow: Secondary | ICD-10-CM | POA: Diagnosis not present

## 2019-09-07 DIAGNOSIS — Z872 Personal history of diseases of the skin and subcutaneous tissue: Secondary | ICD-10-CM | POA: Diagnosis not present

## 2019-09-07 DIAGNOSIS — D225 Melanocytic nevi of trunk: Secondary | ICD-10-CM | POA: Diagnosis not present

## 2019-09-07 DIAGNOSIS — D485 Neoplasm of uncertain behavior of skin: Secondary | ICD-10-CM | POA: Diagnosis not present

## 2019-09-07 DIAGNOSIS — L821 Other seborrheic keratosis: Secondary | ICD-10-CM | POA: Diagnosis not present

## 2019-09-07 DIAGNOSIS — L905 Scar conditions and fibrosis of skin: Secondary | ICD-10-CM | POA: Diagnosis not present

## 2019-09-07 DIAGNOSIS — C44519 Basal cell carcinoma of skin of other part of trunk: Secondary | ICD-10-CM | POA: Diagnosis not present

## 2019-10-18 DIAGNOSIS — C44519 Basal cell carcinoma of skin of other part of trunk: Secondary | ICD-10-CM | POA: Diagnosis not present

## 2019-10-18 DIAGNOSIS — L905 Scar conditions and fibrosis of skin: Secondary | ICD-10-CM | POA: Diagnosis not present

## 2020-04-10 DIAGNOSIS — G4710 Hypersomnia, unspecified: Secondary | ICD-10-CM | POA: Diagnosis not present

## 2020-04-10 DIAGNOSIS — G4733 Obstructive sleep apnea (adult) (pediatric): Secondary | ICD-10-CM | POA: Diagnosis not present

## 2020-09-12 DIAGNOSIS — D225 Melanocytic nevi of trunk: Secondary | ICD-10-CM | POA: Diagnosis not present

## 2020-09-12 DIAGNOSIS — L905 Scar conditions and fibrosis of skin: Secondary | ICD-10-CM | POA: Diagnosis not present

## 2020-09-12 DIAGNOSIS — L821 Other seborrheic keratosis: Secondary | ICD-10-CM | POA: Diagnosis not present

## 2020-09-12 DIAGNOSIS — Z872 Personal history of diseases of the skin and subcutaneous tissue: Secondary | ICD-10-CM | POA: Diagnosis not present

## 2020-09-12 DIAGNOSIS — Z85828 Personal history of other malignant neoplasm of skin: Secondary | ICD-10-CM | POA: Diagnosis not present

## 2020-09-26 DIAGNOSIS — R748 Abnormal levels of other serum enzymes: Secondary | ICD-10-CM | POA: Diagnosis not present

## 2020-09-26 DIAGNOSIS — R1011 Right upper quadrant pain: Secondary | ICD-10-CM | POA: Diagnosis not present

## 2020-09-26 DIAGNOSIS — I10 Essential (primary) hypertension: Secondary | ICD-10-CM | POA: Diagnosis not present

## 2020-09-26 DIAGNOSIS — R509 Fever, unspecified: Secondary | ICD-10-CM | POA: Diagnosis not present

## 2020-09-27 ENCOUNTER — Other Ambulatory Visit: Payer: Self-pay | Admitting: Internal Medicine

## 2020-09-27 DIAGNOSIS — K819 Cholecystitis, unspecified: Secondary | ICD-10-CM

## 2020-09-28 ENCOUNTER — Ambulatory Visit (HOSPITAL_COMMUNITY)
Admission: RE | Admit: 2020-09-28 | Discharge: 2020-09-28 | Disposition: A | Discharge status: Home / Self Care | Payer: 59 | Source: Ambulatory Visit | Attending: Internal Medicine | Admitting: Internal Medicine

## 2020-09-28 ENCOUNTER — Other Ambulatory Visit: Payer: Self-pay

## 2020-09-28 DIAGNOSIS — K819 Cholecystitis, unspecified: Secondary | ICD-10-CM | POA: Insufficient documentation

## 2020-09-28 DIAGNOSIS — R1011 Right upper quadrant pain: Secondary | ICD-10-CM | POA: Diagnosis not present

## 2021-04-10 DIAGNOSIS — H35372 Puckering of macula, left eye: Secondary | ICD-10-CM | POA: Diagnosis not present

## 2021-04-10 DIAGNOSIS — H04123 Dry eye syndrome of bilateral lacrimal glands: Secondary | ICD-10-CM | POA: Diagnosis not present
